# Patient Record
Sex: Male | Born: 1985
Health system: Southern US, Community
[De-identification: ages and names within clinical notes are randomized; demographics above are authoritative.]

---

## 2013-12-10 ENCOUNTER — Ambulatory Visit: Payer: Self-pay | Admitting: Family Medicine

## 2013-12-10 DIAGNOSIS — Z0289 Encounter for other administrative examinations: Secondary | ICD-10-CM

## 2013-12-24 ENCOUNTER — Other Ambulatory Visit (INDEPENDENT_AMBULATORY_CARE_PROVIDER_SITE_OTHER): Payer: 59

## 2013-12-24 ENCOUNTER — Ambulatory Visit (INDEPENDENT_AMBULATORY_CARE_PROVIDER_SITE_OTHER): Payer: 59 | Admitting: Family Medicine

## 2013-12-24 VITALS — BP 114/78 | HR 68 | Ht 73.0 in | Wt 195.0 lb

## 2013-12-24 DIAGNOSIS — M25561 Pain in right knee: Secondary | ICD-10-CM

## 2013-12-24 DIAGNOSIS — M76829 Posterior tibial tendinitis, unspecified leg: Secondary | ICD-10-CM | POA: Insufficient documentation

## 2013-12-24 DIAGNOSIS — M2142 Flat foot [pes planus] (acquired), left foot: Secondary | ICD-10-CM | POA: Insufficient documentation

## 2013-12-24 DIAGNOSIS — S83429A Sprain of lateral collateral ligament of unspecified knee, initial encounter: Secondary | ICD-10-CM

## 2013-12-24 DIAGNOSIS — M23041 Cystic meniscus, anterior horn of lateral meniscus, right knee: Secondary | ICD-10-CM | POA: Insufficient documentation

## 2013-12-24 DIAGNOSIS — M2141 Flat foot [pes planus] (acquired), right foot: Secondary | ICD-10-CM | POA: Insufficient documentation

## 2013-12-24 DIAGNOSIS — M2140 Flat foot [pes planus] (acquired), unspecified foot: Secondary | ICD-10-CM

## 2013-12-24 DIAGNOSIS — M6789 Other specified disorders of synovium and tendon, multiple sites: Secondary | ICD-10-CM

## 2013-12-24 DIAGNOSIS — M25569 Pain in unspecified knee: Secondary | ICD-10-CM

## 2013-12-24 DIAGNOSIS — M23302 Other meniscus derangements, unspecified lateral meniscus, unspecified knee: Secondary | ICD-10-CM

## 2013-12-24 MED ORDER — MELOXICAM 15 MG PO TABS: 15.0000 mg | ORAL_TABLET | Freq: Every day | ORAL | Status: DC

## 2013-12-24 NOTE — Assessment & Plan Note (Signed)
Patient was given home exercise program for this reason. Patient will try an over-the-counter strap as well as icing protocol and anti-inflammatories. Patient will try these interventions and come back again in 3 weeks for further evaluation.

## 2013-12-24 NOTE — Patient Instructions (Signed)
Good to meet you For your knee you need a patella strap or chopat strap.  Ice 20 minutes after activity and before bed can help/.  Exercises most days of the week.  Meloxicam daily for 10 days.  For your ankle try follow ing exercises as well [employer] orthotics at [employer] or online.  Come back again in 3 weeks.   Posterior Tibial Tendon Tendinitis with Rehab Tendonitis is a condition that is characterized by inflammation of a tendon or the lining (sheath) that surrounds it. The inflammation is usually caused by damage to the tendon, such as a tendon tear (strain). Sprains are classified into three categories. Grade 1 sprains cause pain, but the tendon is not lengthened. Grade 2 sprains include a lengthened ligament due to the ligament being stretched or partially ruptured. With grade 2 sprains there is still function, although the function may be diminished. Grade 3 sprains are characterized by a complete tear of the tendon or muscle, and function is usually impaired. Posterior tibialis tendonitis is tendonitis of the posterior tibial tendon, which attaches muscles of the lower leg to the foot. The posterior tibial tendon is located in the back of the ankle and helps the body straighten (plantarflex) and rotate inward (medially rotate) the ankle. SYMPTOMS   Pain, tenderness, swelling, warmth, and/or redness over the back of the inner ankle at the posterior tibial tendon or the inner part of the mid-foot.  Pain that worsens with plantarflexion or medial rotation of the ankle.  A crackling sound (crepitation) when the tendon is moved or touched. CAUSES  Posterior tibial tendonitis occurs when damage to the posterior tibial tendon starts an inflammatory response. Common mechanisms of injury include:  Degenerative (occurs with aging) processes that weaken the tendon and make it more susceptible to injury.  Stress placed on the tendon from an increase in the intensity, frequency, or duration of  training.  Direct trauma to the ankle.  Returning to activity before a previous ankle injury is allowed to heal. RISK INCREASES WITH:  Activities that involve repetitive and/or stressful plantarflexion (jumping, kicking, or running up/down hills).  Poor strength and flexibility.  Flat feet.  Previous injury to the foot, ankle, or leg. PREVENTION   Warm up and stretch properly before activity.  Allow for adequate recovery between workouts.  Maintain physical fitness:  Strength, flexibility, and endurance.  Cardiovascular fitness.  Learn and use proper technique. When possible, have a coach correct improper technique.  Complete rehabilitation from a previous foot, ankle, or leg injury.  If you have flat feet, wear arch supports (orthotics). PROGNOSIS  If treated properly, then the symptoms of tendonitis usually resolve within 6 weeks. This period may be shorter for injuries caused by direct trauma. RELATED COMPLICATIONS   Prolonged healing time, if improperly treated or re-injured.  Recurrent symptoms that result in a chronic problem.  Partial or complete tendon tear (rupture) requiring surgery. TREATMENT  Treatment initially involves the use of ice and medication to help reduce pain and inflammation. The use of strengthening and stretching exercises may help reduce pain with activity. These exercises may be performed at home or with referral to a therapist. Often times, your caregiver will recommend immobilizing the ankle to allow the tendon to heal. If you have flat feet, the you may be advised to wear orthotic arch supports. If symptoms persist for greater than 6 months despite non-surgical (conservative) treatment, then surgery may be recommended. MEDICATION   If pain medication is necessary, then nonsteroidal anti-inflammatory  medications, such as aspirin and ibuprofen, or other minor pain relievers, such as acetaminophen, are often recommended.  Do not take pain  medication for 7 days before surgery.  Prescription pain relievers may be given if deemed necessary by your caregiver. Use only as directed and only as much as you need.  Corticosteroid injections may be given by your caregiver. These injections should be reserved for the most serious cases, because they may only be given a certain number of times. HEAT AND COLD  Cold treatment (icing) relieves pain and reduces inflammation. Cold treatment should be applied for 10 to 15 minutes every 2 to 3 hours for inflammation and pain and immediately after any activity that aggravates your symptoms. Use ice packs or massage the area with a piece of ice (ice massage).  Heat treatment may be used prior to performing the stretching and strengthening activities prescribed by your caregiver, physical therapist, or athletic trainer. Use a heat pack or soak the injury in warm water. SEEK MEDICAL CARE IF:  Treatment seems to offer no benefit, or the condition worsens.  Any medications produce adverse side effects. EXERCISES RANGE OF MOTION (ROM) AND STRETCHING EXERCISES - Posterior Tibial Tendon Tendinitis These exercises may help you when beginning to rehabilitate your injury. Your symptoms may resolve with or without further involvement from your physician, physical therapist or athletic trainer. While completing these exercises, remember:   Restoring tissue flexibility helps normal motion to return to the joints. This allows healthier, less painful movement and activity.  An effective stretch should be held for at least 30 seconds.  A stretch should never be painful. You should only feel a gentle lengthening or release in the stretched tissue. RANGE OF MOTION - Ankle Plantar Flexion   Sit with your right / left leg crossed over your opposite knee.  Use your opposite hand to pull the top of your foot and toes toward you.  You should feel a gentle stretch on the top of your foot/ankle. Hold this position  for __________ seconds. Repeat __________ times. Complete this exercise __________ times per day.  RANGE OF MOTION - Ankle Eversion   Sit with your right / left ankle crossed over your opposite knee.  Grip your foot with your opposite hand, placing your thumb on the top of your foot and your fingers across the bottom of your foot.  Gently push your foot downward with a slight rotation so your littlest toes rise slightly  You should feel a gentle stretch on the inside of your ankle. Hold the stretch for __________ seconds. Repeat __________ times. Complete this exercise __________ times per day.  RANGE OF MOTION - Ankle Inversion   Sit with your right / left ankle crossed over your opposite knee.  Grip your foot with your opposite hand, placing your thumb on the bottom of your foot and your fingers across the top of your foot.  Gently pull your foot so the smallest toe comes toward you and your thumb pushes the inside of the ball of your foot away from you.  You should feel a gentle stretch on the outside of your ankle. Hold the stretch for __________ seconds. Repeat __________ times. Complete this exercise __________ times per day.  RANGE OF MOTION - Dorsi/Plantar Flexion  While sitting with your right / left knee straight, draw the top of your foot upwards by flexing your ankle. Then reverse the motion, pointing your toes downward.  Hold each position for __________ seconds.  After completing  your first set of exercises, repeat this exercise with your knee bent. Repeat __________ times. Complete this exercise __________ times per day.  RANGE OF MOTION - Ankle Alphabet  Imagine your right / left big toe is a pen.  Keeping your hip and knee still, write out the entire alphabet with your "pen." Make the letters as large as you can without increasing any discomfort. Repeat __________ times. Complete this exercise __________ times per day.  STRETCH - Gastrocsoleus   Sit with your  right / left leg extended. Holding onto both ends of a belt or towel, loop it around the ball of your foot.  Keeping your right / left ankle and foot relaxed and your knee straight, pull your foot and ankle toward you using the belt/towel.  You should feel a gentle stretch behind your calf or knee. Hold this position for __________ seconds. Repeat __________ times. Complete this exercise __________ times per day.  STRETCH  Gastroc, Standing   Place hands on wall.  Extend right / left leg, keeping the front knee somewhat bent.  Slightly point your toes inward on your back foot.  Keeping your right / left heel on the floor and your knee straight, shift your weight toward the wall, not allowing your back to arch.  You should feel a gentle stretch in the right / left calf. Hold this position for __________ seconds. Repeat __________ times. Complete this stretch __________ times per day. STRETCH  Soleus, Standing   Place hands on wall.  Extend right / left leg, keeping the other knee somewhat bent.  Slightly point your toes inward on your back foot.  Keep your right / left heel on the floor, bend your back knee, and slightly shift your weight over the back leg so that you feel a gentle stretch deep in your back calf.  Hold this position for __________ seconds. Repeat __________ times. Complete this stretch __________ times per day. STRENGTHENING EXERCISES - Posterior Tibial Tendon Tendinitis These exercises may help you when beginning to rehabilitate your injury. They may resolve your symptoms with or without further involvement from your physician, physical therapist or athletic trainer. While completing these exercises, remember:   Muscles can gain both the endurance and the strength needed for everyday activities through controlled exercises.  Complete these exercises as instructed by your physician, physical therapist or athletic trainer. Progress the resistance and repetitions only  as guided. STRENGTH - Dorsiflexors  Secure a rubber exercise band/tubing to a fixed object (ie. table, pole) and loop the other end around your right / left foot.  Sit on the floor facing the fixed object. The band/tubing should be slightly tense when your foot is relaxed.  Slowly draw your foot back toward you using your ankle and toes.  Hold this position for __________ seconds. Slowly release the tension in the band and return your foot to the starting position. Repeat __________ times. Complete this exercise __________ times per day.  STRENGTH - Towel Curls  Sit in a chair positioned on a non-carpeted surface.  Place your foot on a towel, keeping your heel on the floor.  Pull the towel toward your heel by only curling your toes. Keep your heel on the floor.  If instructed by your physician, physical therapist or athletic trainer, add ____________________ at the end of the towel. Repeat __________ times. Complete this exercise __________ times per day. STRENGTH - Ankle Eversion   Secure one end of a rubber exercise band/tubing to a fixed object (  table, pole). Loop the other end around your foot just before your toes.  Place your fists between your knees. This will focus your strengthening at your ankle.  Drawing the band/tubing across your opposite foot, slowly, pull your little toe out and up. Make sure the band/tubing is positioned to resist the entire motion.  Hold this position for __________ seconds.  Have your muscles resist the band/tubing as it slowly pulls your foot back to the starting position. Repeat __________ times. Complete this exercise __________ times per day.  STRENGTH - Ankle Inversion   Secure one end of a rubber exercise band/tubing to a fixed object (table, pole). Loop the other end around your foot just before your toes.  Place your fists between your knees. This will focus your strengthening at your ankle.  Slowly, pull your big toe up and in, making  sure the band/tubing is positioned to resist the entire motion.  Hold this position for __________ seconds.  Have your muscles resist the band/tubing as it slowly pulls your foot back to the starting position. Repeat __________ times. Complete this exercises __________ times per day.  Document Released: 06/28/2005 Document Revised: 09/20/2011 Document Reviewed: 10/10/2008 [facility] Patient Information 2014 [place], [place].

## 2013-12-24 NOTE — Progress Notes (Signed)
Tawana ScaleZach Anushree Cantu D.O. [facility] [address] [place], [place] [address] Phone: [phone] Subjective:     CC: Right knee pain  GNF:[id number]:Subjective Raymond LazierShannon Robinson-Funny is a 28 y.o. male coming in with complaint of right knee pain. Patient states he has had this pain for multiple years. Patient used to play significant number of sports and still is very active. Patient states that he has pain on the anterior lateral aspect of his knee after activity and notices some swelling from time to time. Patient states that the knee feels stable and never feels like it is going to give out on him. Denies any radiation of pain or any numbness. Patient denies any locking on him. Patient has not tried any physical modalities but just tries to change his activity. Patient was the severity a 6/10. Denies any nighttime awakening's. No radiation no numbness.     Past medical history, social, surgical and family history all reviewed in electronic medical record.   Review of Systems: No headache, visual changes, nausea, vomiting, diarrhea, constipation, dizziness, abdominal pain, skin rash, fevers, chills, night sweats, weight loss, swollen lymph nodes, body aches, joint swelling, muscle aches, chest pain, shortness of breath, mood changes.   Objective Blood pressure 114/78, pulse 68, height 6\' 1"  (1.854 m), weight 195 lb (88.451 kg), SpO2 97.00%.  General: No apparent distress alert and oriented x3 mood and affect normal, dressed appropriately.  HEENT: Pupils equal, extraocular movements intact  Respiratory: Patient's speak in full sentences and does not appear short of breath  Cardiovascular: No lower extremity edema, non tender, no erythema  Skin: Warm dry intact with no signs of infection or rash on extremities or on axial skeleton.  Abdomen: Soft nontender  Neuro: Cranial nerves II through XII are intact, neurovascularly intact in all extremities with 2+ DTRs and 2+ pulses.  Lymph: No  lymphadenopathy of posterior or anterior cervical chain or axillae bilaterally.  Gait normal with good balance and coordination.  MSK:  Non tender with full range of motion and good stability and symmetric strength and tone of shoulders, elbows, wrist, hip, and ankles bilaterally.  Knee: Right On inspection patient's does have very large hypertrophy of the tibial tuberosity bilaterally. Patient is minimally tender over the anterior lateral joint line. ROM full in flexion and extension and lower leg rotation. Ligaments with solid consistent endpoints including ACL, PCL, LCL, MCL. Negative Mcmurray's, Apley's, and Thessalonian tests. Non painful patellar compression. Patellar glide without crepitus. Patellar and quadriceps tendons unremarkable. Hamstring and quadriceps strength is normal.  Contralateral knee unremarkable except for the hypertrophy of the tibial tuberosity.  Exam shows the patient does have pes planus bilaterally. And he is minimally tender to palpation of the posterior tibialis especially of the right side. Neurovascularly intact distally.  MSK US performed of: Right knee This study was ordered, performed, and interpreted by Terrilee FilesZach Luvina Poirier D.O.  Knee: All structures visualized. Anteromedial, , posteromedial, and posterolateral menisci unremarkable without tearing, fraying, effusion, or displacement. Patient on the inferior portion of the fat pad does have what appears to be acutely and cyst formation just proximal to the tibial tuberosity. This does cause some mild discomfort with palpation. In addition patient also has what appears to be apparent meniscal cyst of the lateral meniscus. Patient also has some hypoechoic changes around the LCL. Patellar Tendon unremarkable on long and transverse views without effusion. No abnormality of prepatellar bursa.  MCL unremarkable on long and transverse views. No abnormality of origin of medial or  lateral head of the  gastrocnemius.  IMPRESSION:  Paramedical cyst the lateral meniscus as well as ganglion cyst inferior to the patellar tendon and questionable LCL injury.      Impression and Recommendations:     This case required medical decision making of moderate complexity.

## 2013-12-24 NOTE — Assessment & Plan Note (Signed)
Think this likely is just a incidental finding and is not likely what giving patient pain. Patient had a negative McMurray's on exam today. Patient is still able to do all other activities and will come back again in 3 weeks for further evaluation.

## 2013-12-24 NOTE — Assessment & Plan Note (Signed)
Patient's 5 feet could be contributing patient's knee pain. We discussed over-the-counter orthotics was given home exercise program for the posterior tibialis tendons to see if this will make some improvement. We will redress again in 3 weeks.

## 2014-01-14 ENCOUNTER — Ambulatory Visit (INDEPENDENT_AMBULATORY_CARE_PROVIDER_SITE_OTHER)
Admission: RE | Admit: 2014-01-14 | Discharge: 2014-01-14 | Disposition: A | Discharge status: Home / Self Care | Payer: 59 | Source: Ambulatory Visit | Attending: Family Medicine | Admitting: Family Medicine

## 2014-01-14 ENCOUNTER — Ambulatory Visit (INDEPENDENT_AMBULATORY_CARE_PROVIDER_SITE_OTHER): Payer: 59 | Admitting: Family Medicine

## 2014-01-14 VITALS — BP 120/72 | HR 68 | Ht 73.0 in | Wt 198.0 lb

## 2014-01-14 DIAGNOSIS — M25569 Pain in unspecified knee: Secondary | ICD-10-CM

## 2014-01-14 DIAGNOSIS — M25561 Pain in right knee: Secondary | ICD-10-CM

## 2014-01-14 DIAGNOSIS — M23041 Cystic meniscus, anterior horn of lateral meniscus, right knee: Secondary | ICD-10-CM

## 2014-01-14 DIAGNOSIS — M23302 Other meniscus derangements, unspecified lateral meniscus, unspecified knee: Secondary | ICD-10-CM

## 2014-01-14 NOTE — Assessment & Plan Note (Signed)
Patient did have injection within the anterior horn of the lateral meniscus cyst. Patient tolerated procedure well with complete resolution of the pain. It appeared that the cyst was larger than anticipated. No aspiration the was able to be obtained. We discussed bracing still in avoiding any deep squats for another 2 weeks. We discussed repetitive jumping may not be beneficial. Patient is to do a trial of a topical anti-inflammatory as well as change the positioning of the brace. Patient did have a patellar strap and we'll change it to be more higher causing a changing fulcrum of the patellar itself. Patient and will come back again in 3 weeks for further evaluation and treatment.  Spent greater than 25 minutes with patient face-to-face and had greater than 50% of counseling including as described above in assessment and plan.

## 2014-01-14 NOTE — Patient Instructions (Signed)
Good to see you Try new exercises 3 times a week  Ice in 6 hours and after activity and before bed Wear brace more on the bottom of the patella.  Try the medicine 2 times daily until it is gone.  Xray downstairs today.  Come back again in 3 weeks.

## 2014-01-14 NOTE — Progress Notes (Signed)
  Tawana ScaleZach Ruthetta Koopmann D.O. [facility] [address] [place], [place] [address] Phone: [phone]([phone] Subjective:     CC: Right knee pain followup  UUV:[id number]:Subjective Jaci LazierShannon Robinson-Funny is a 28 y.o. male coming in with complaint of right knee pain. Patient states that she's been doing exercises as well as wearing a brace without any significant improvement. Patient states when he tries to do a lot of activity he starts having pain. Patient was seen previously and did have apparent meniscal cyst mostly over the lateral aspect. Patient also had a ganglion cyst that appeared to be on ultrasound the patient states is the main area of discomfort. No new symptoms has the same symptoms he said previously. Denies any worsening of the pain just no improvement.  Patient states that the ankle pain he was having previously is completely resolved at this time.     Past medical history, social, surgical and family history all reviewed in electronic medical record.   Review of Systems: No headache, visual changes, nausea, vomiting, diarrhea, constipation, dizziness, abdominal pain, skin rash, fevers, chills, night sweats, weight loss, swollen lymph nodes, body aches, joint swelling, muscle aches, chest pain, shortness of breath, mood changes.   Objective Blood pressure 120/72, pulse 68, height 6\' 1"  (1.854 m), weight 198 lb (89.812 kg), SpO2 97.00%.  General: No apparent distress alert and oriented x3 mood and affect normal, dressed appropriately.  HEENT: Pupils equal, extraocular movements intact  Respiratory: Patient's speak in full sentences and does not appear short of breath  Cardiovascular: No lower extremity edema, non tender, no erythema  Skin: Warm dry intact with no signs of infection or rash on extremities or on axial skeleton.  Abdomen: Soft nontender  Neuro: Cranial nerves II through XII are intact, neurovascularly intact in all extremities with 2+ DTRs and 2+ pulses.  Lymph: No  lymphadenopathy of posterior or anterior cervical chain or axillae bilaterally.  Gait normal with good balance and coordination.  MSK:  Non tender with full range of motion and good stability and symmetric strength and tone of shoulders, elbows, wrist, hip, and ankles bilaterally.  Knee: Right On inspection patient's does have very large hypertrophy of the tibial tuberosity bilaterally. Patient is minimally tender over the anterior lateral joint line. ROM full in flexion and extension and lower leg rotation. Ligaments with solid consistent endpoints including ACL, PCL, LCL, MCL. Negative Mcmurray's, Apley's, and Thessalonian tests. Non painful patellar compression. Patellar glide without crepitus. Patellar and quadriceps tendons unremarkable. Hamstring and quadriceps strength is normal.  Contralateral knee unremarkable except for the hypertrophy of the tibial tuberosity.  Exam shows the patient does have pes planus bilaterally. And he is minimally tender to palpation of the posterior tibialis especially of the right side. Neurovascularly intact distally.  After informed written and verbal consent, patient was seated on exam table. Right knee was prepped with alcohol swab and utilizing anterolateral approach, patient's right knee space was injected with 4:1  marcaine 0.5%: Kenalog 40mg /dL. Patient tolerated the procedure well without immediate complications.     Impression and Recommendations:     This case required medical decision making of moderate complexity.

## 2014-02-04 ENCOUNTER — Ambulatory Visit: Payer: 59 | Admitting: Family Medicine

## 2014-02-11 ENCOUNTER — Ambulatory Visit (INDEPENDENT_AMBULATORY_CARE_PROVIDER_SITE_OTHER): Payer: 59 | Admitting: Family Medicine

## 2014-02-11 VITALS — BP 124/84 | HR 80 | Ht 73.0 in | Wt 196.0 lb

## 2014-02-11 DIAGNOSIS — M25561 Pain in right knee: Secondary | ICD-10-CM

## 2014-02-11 DIAGNOSIS — M23041 Cystic meniscus, anterior horn of lateral meniscus, right knee: Secondary | ICD-10-CM

## 2014-02-11 DIAGNOSIS — M23302 Other meniscus derangements, unspecified lateral meniscus, unspecified knee: Secondary | ICD-10-CM

## 2014-02-11 DIAGNOSIS — M25569 Pain in unspecified knee: Secondary | ICD-10-CM

## 2014-02-11 NOTE — Assessment & Plan Note (Signed)
Patient overall has improved on physical exam within normal exam. Patient is still gives history of internal derangement type symptoms. I discussed with him the possibility of an MRI for further evaluation which patient declined today. Patient will start formal physical therapy though. We will have sent in the referral. Discuss continued home exercises as well as bracing. We'll continue with the icing regimen as well. Patient will follow up in 4 weeks. If continuing to have pain at that time I think I would have to encourage advance imaging to rule out any other laxity in the ligaments that has not been appreciated on exam.  Spent greater than 25 minutes with patient face-to-face and had greater than 50% of counseling including as described above in assessment and plan.

## 2014-02-11 NOTE — Patient Instructions (Signed)
Good to see you Physical therapy will be calling you.  Still ice and brace and home exercises.  If at gym would love for you to bike.  If having pain with increase activity more than just muscle pain then call me and we will order MRI first.  Otherwise come back in 4 weeks.

## 2014-02-11 NOTE — Progress Notes (Signed)
  Tawana ScaleZach Rhyan Radler D.O. [facility] [address] [place], [place] [address] Phone: [phone] Subjective:     CC: Right knee pain followup  OZH:[id number]:Subjective Raymond LazierShannon Cantu is a 28 y.o. male coming in with complaint of right knee pain. Patient was seen previously and did have an injection for a cyst on the anterior horn of the lateral meniscus. Patient also had an LCL sprain. Patient states that the knee pain has improved but is still having times when it clicks or feels like it's giving out on him. Patient is not playing any lacrosse because he feels that the knee is unstable overall. Patient continues to do the exercises he states fairly regularly. He is trying to remain active. Denies any new symptoms except for increasing popping but once again states that the pain is better after injection.  Patient states that the ankle pain he was having previously is completely resolved at this time.     Past medical history, social, surgical and family history all reviewed in electronic medical record.   Review of Systems: No headache, visual changes, nausea, vomiting, diarrhea, constipation, dizziness, abdominal pain, skin rash, fevers, chills, night sweats, weight loss, swollen lymph nodes, body aches, joint swelling, muscle aches, chest pain, shortness of breath, mood changes.   Objective Blood pressure 124/84, pulse 80, height 6\' 1"  (1.854 m), weight 196 lb (88.905 kg), SpO2 97.00%.  General: No apparent distress alert and oriented x3 mood and affect normal, dressed appropriately. Patient did not make significant I contacted playing with phone HEENT: Pupils equal, extraocular movements intact  Respiratory: Patient's speak in full sentences and does not appear short of breath  Cardiovascular: No lower extremity edema, non tender, no erythema  Skin: Warm dry intact with no signs of infection or rash on extremities or on axial skeleton.  Abdomen: Soft nontender  Neuro: Cranial nerves  II through XII are intact, neurovascularly intact in all extremities with 2+ DTRs and 2+ pulses.  Lymph: No lymphadenopathy of posterior or anterior cervical chain or axillae bilaterally.  Gait normal with good balance and coordination.  MSK:  Non tender with full range of motion and good stability and symmetric strength and tone of shoulders, elbows, wrist, hip, and ankles bilaterally.  Knee: Right On inspection patient's does have very large hypertrophy of the tibial tuberosity bilaterally. Patient is minimally tender over the anterior lateral joint line. ROM full in flexion and extension and lower leg rotation. Ligaments with solid consistent endpoints including ACL, PCL, LCL, MCL. Negative Mcmurray's, Apley's, and Thessalonian tests. Non painful patellar compression. Patellar glide without crepitus. Patellar and quadriceps tendons unremarkable. Hamstring and quadriceps strength is normal.  Contralateral knee unremarkable except for the hypertrophy of the tibial tuberosity.   limited a few skeletal ultrasound was performed and interpreted by me today. Limited ultrasound of the right knee shows the patient does have a very small effusion of the knee still. Patient's toe cyst on the lateral meniscus is resolved.  Impression: Resolved lateral meniscus cyst but continued suprapatellar effusion.     Impression and Recommendations:     This case required medical decision making of moderate complexity.

## 2014-02-25 ENCOUNTER — Ambulatory Visit: Payer: 59 | Admitting: Physical Therapy

## 2014-03-11 ENCOUNTER — Ambulatory Visit: Payer: 59 | Admitting: Family Medicine

## 2015-05-21 IMAGING — CR DG KNEE STANDING AP BILAT
1 series · 1 of 1 positions shown · non-contrast
Comparison: None.

CLINICAL DATA: Right knee pain.

EXAM:
BILATERAL KNEES STANDING - 1 VIEW

[view not recorded]
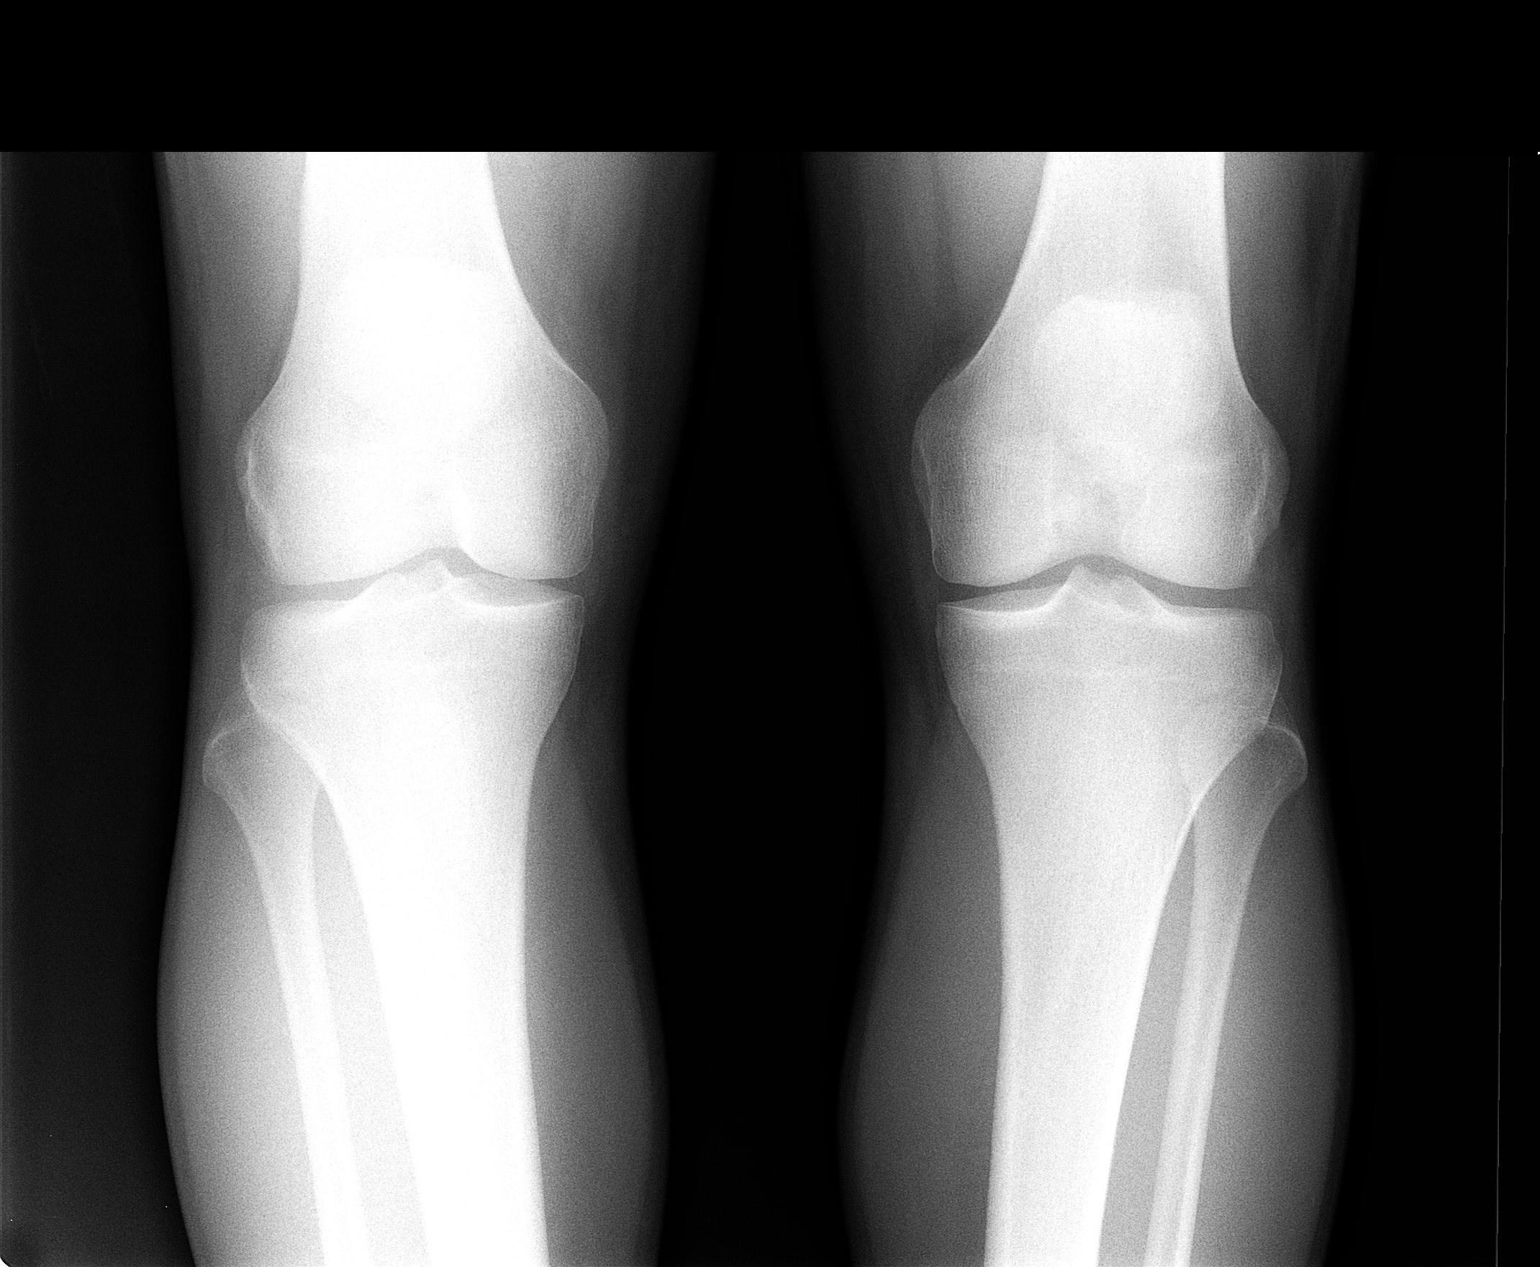

[1 of 1 positions shown; findings below may reference images not displayed]

FINDINGS: Standing views of both knees demonstrates no evidence of joint space
narrowing, osteophyte formation, or other abnormality.
IMPRESSION: Negative.

## 2016-08-11 DIAGNOSIS — Z131 Encounter for screening for diabetes mellitus: Secondary | ICD-10-CM | POA: Diagnosis not present

## 2016-08-11 DIAGNOSIS — Z13220 Encounter for screening for lipoid disorders: Secondary | ICD-10-CM | POA: Diagnosis not present

## 2016-08-11 DIAGNOSIS — Z0000 Encounter for general adult medical examination without abnormal findings: Secondary | ICD-10-CM | POA: Diagnosis not present

## 2017-08-19 DIAGNOSIS — Z0000 Encounter for general adult medical examination without abnormal findings: Secondary | ICD-10-CM | POA: Diagnosis not present

## 2018-09-12 DIAGNOSIS — J069 Acute upper respiratory infection, unspecified: Secondary | ICD-10-CM | POA: Diagnosis not present

## 2019-01-25 ENCOUNTER — Ambulatory Visit (INDEPENDENT_AMBULATORY_CARE_PROVIDER_SITE_OTHER): Payer: 59 | Admitting: Internal Medicine

## 2019-01-25 ENCOUNTER — Other Ambulatory Visit: Payer: Self-pay

## 2019-01-25 ENCOUNTER — Ambulatory Visit: Payer: Self-pay | Admitting: Internal Medicine

## 2019-01-25 ENCOUNTER — Other Ambulatory Visit (INDEPENDENT_AMBULATORY_CARE_PROVIDER_SITE_OTHER): Payer: 59

## 2019-01-25 ENCOUNTER — Encounter: Payer: Self-pay | Admitting: Internal Medicine

## 2019-01-25 VITALS — BP 110/80 | HR 80 | Temp 98.5°F | Ht 73.0 in | Wt 207.0 lb

## 2019-01-25 DIAGNOSIS — Z0000 Encounter for general adult medical examination without abnormal findings: Secondary | ICD-10-CM

## 2019-01-25 DIAGNOSIS — M23041 Cystic meniscus, anterior horn of lateral meniscus, right knee: Secondary | ICD-10-CM

## 2019-01-25 DIAGNOSIS — H6123 Impacted cerumen, bilateral: Secondary | ICD-10-CM

## 2019-01-25 DIAGNOSIS — Z833 Family history of diabetes mellitus: Secondary | ICD-10-CM | POA: Diagnosis not present

## 2019-01-25 LAB — LIPID PANEL
Cholesterol: 155 mg/dL (ref 0–200)
HDL: 38.7 mg/dL — ABNORMAL LOW (ref >39.00)
LDL Cholesterol: 89 mg/dL (ref 0–99)
NonHDL: 116.52
Total CHOL/HDL Ratio: 4
Triglycerides: 140 mg/dL (ref 0.0–149.0)
VLDL: 28 mg/dL (ref 0.0–40.0)

## 2019-01-25 LAB — BASIC METABOLIC PANEL
BUN: 13 mg/dL (ref 6–23)
CO2: 28 mEq/L (ref 19–32)
Calcium: 10 mg/dL (ref 8.4–10.5)
Chloride: 104 mEq/L (ref 96–112)
Creatinine, Ser: 1.1 mg/dL (ref 0.40–1.50)
GFR: 94.33 mL/min (ref >60.00)
Glucose, Bld: 75 mg/dL (ref 70–99)
Potassium: 4.2 mEq/L (ref 3.5–5.1)
Sodium: 138 mEq/L (ref 135–145)

## 2019-01-25 LAB — HEMOGLOBIN A1C: Hgb A1c MFr Bld: 5.6 % (ref 4.6–6.5)

## 2019-01-25 NOTE — Progress Notes (Signed)
Subjective:  Patient ID: Raymond Cantu, male    DOB: 1986/04/08  Age: 33 y.o. MRN: [id number]  CC: Annual Exam  NEW TO ME  HPI Raymond Cantu presents for a CPX.  He complains of a several week history of decreased hearing on both sides.  He denies ear pain or swelling.  History Raymond Cantu has no past medical history on file.   He has no past surgical history on file.   His family history includes Diabetes Mellitus II in his father and mother; Heart failure in his mother.He reports that he has never smoked. He has never used smokeless tobacco. He reports previous alcohol use. He reports that he does not use drugs.  Outpatient Medications Prior to Visit  Medication Sig Dispense Refill  . meloxicam (MOBIC) 15 MG tablet Take 1 tablet (15 mg total) by mouth daily. 30 tablet 0   No facility-administered medications prior to visit.     ROS Review of Systems  Constitutional: Negative.   HENT: Positive for hearing loss. Negative for congestion, ear pain, postnasal drip, sinus pressure and tinnitus.   Eyes: Negative.   Respiratory: Negative for cough and shortness of breath.   Cardiovascular: Negative.   Gastrointestinal: Negative for abdominal pain, constipation, diarrhea, nausea and vomiting.  Endocrine: Negative.   Genitourinary: Negative.  Negative for difficulty urinating, discharge, dysuria, scrotal swelling, testicular pain and urgency.  Musculoskeletal: Negative.   Skin: Negative.  Negative for color change.  Neurological: Negative.  Negative for dizziness, weakness, light-headedness and headaches.  Hematological: Negative for adenopathy. Does not bruise/bleed easily.  Psychiatric/Behavioral: Negative.     Objective:  BP 110/80 (BP Location: Left Arm, Patient Position: Sitting, Cuff Size: Normal)   Pulse 80   Temp 98.5 F (36.9 C) (Oral)   Ht 6\' 1"  (1.854 m)   Wt 207 lb (93.9 kg)   SpO2 99%   BMI 27.31 kg/m   Physical Exam Vitals signs reviewed.   HENT:     Right Ear: There is impacted cerumen.     Left Ear: There is impacted cerumen.     Ears:     Comments: Patient's verbal consent obtained prior to lighted suction removal.  I used the Bionix lighted suction to remove the cerumen.  The cerumen was removed and a full view of tympanic membrane was possible after the procedure.  Patient tolerated procedure well.     Nose: Nose normal. No congestion or rhinorrhea.     Mouth/Throat:     Mouth: Mucous membranes are moist.  Eyes:     General: No scleral icterus.    Conjunctiva/sclera: Conjunctivae normal.  Neck:     Musculoskeletal: Normal range of motion. No neck rigidity.  Cardiovascular:     Rate and Rhythm: Normal rate and regular rhythm.     Heart sounds: No murmur.  Pulmonary:     Effort: Pulmonary effort is normal.     Breath sounds: No stridor. No wheezing, rhonchi or rales.  Abdominal:     General: Abdomen is flat. Bowel sounds are normal. There is no distension.     Palpations: There is no hepatomegaly or splenomegaly.     Tenderness: There is no abdominal tenderness.     Hernia: No hernia is present.  Musculoskeletal: Normal range of motion.  Lymphadenopathy:     Cervical: No cervical adenopathy.  Skin:    General: Skin is warm.     Findings: No rash.  Neurological:     General: No focal deficit present.  Psychiatric:        Mood and Affect: Mood normal.        Behavior: Behavior normal.     Lab Results  Component Value Date   GLUCOSE 75 01/25/2019   CHOL 155 01/25/2019   TRIG 140.0 01/25/2019   HDL 38.70 (L) 01/25/2019   LDLCALC 89 01/25/2019   NA 138 01/25/2019   K 4.2 01/25/2019   CL 104 01/25/2019   CREATININE 1.10 01/25/2019   BUN 13 01/25/2019   CO2 28 01/25/2019   HGBA1C 5.6 01/25/2019    Assessment & Plan:   Raymond Cantu was seen today for annual exam.  Diagnoses and all orders for this visit:  Routine general medical examination at a health care facility- Exam completed, labs reviewed,  vaccines reviewed, patient education material was given. -     Lipid panel; Future  Hearing loss due to cerumen impaction, bilateral- I put Colace in the right ear and then used an ear pick to remove the cerumen.  Most of the cerumen was removed but there is some that is adhered to the tympanic membrane.  He will come back in a week or 2 to have the cerumen removed from the left ear and to try to make more progress on the right ear.  Cyst of anterior horn of lateral meniscus of right knee  Family history of diabetes mellitus type II- His blood sugars are normal.  He was advised of a healthy lifestyle to try to prevent the development of diabetes. -     Basic metabolic panel; Future -     Hemoglobin A1c; Future   I have discontinued Raymond Cantu's meloxicam.  No orders of the defined types were placed in this encounter.    Follow-up: Return if symptoms worsen or fail to improve.  Raymond Calico, MD

## 2019-01-25 NOTE — Patient Instructions (Signed)

## 2019-01-26 ENCOUNTER — Encounter: Payer: Self-pay | Admitting: Internal Medicine

## 2019-02-08 ENCOUNTER — Encounter: Payer: Self-pay | Admitting: Internal Medicine

## 2019-02-08 ENCOUNTER — Ambulatory Visit (INDEPENDENT_AMBULATORY_CARE_PROVIDER_SITE_OTHER): Payer: 59 | Admitting: Internal Medicine

## 2019-02-08 ENCOUNTER — Other Ambulatory Visit: Payer: Self-pay

## 2019-02-08 VITALS — BP 130/70 | HR 78 | Temp 98.6°F | Resp 16 | Ht 73.0 in | Wt 203.0 lb

## 2019-02-08 DIAGNOSIS — H6123 Impacted cerumen, bilateral: Secondary | ICD-10-CM | POA: Diagnosis not present

## 2019-02-08 NOTE — Progress Notes (Signed)
Subjective:  Patient ID: Raymond Cantu, male    DOB: July 03, 1986  Age: 33 y.o. MRN: [id number]  CC: Hearing Loss   HPI [name] presents for f/up - He continues to struggle with hearing loss and wants the cerumen removed from both ears.  No outpatient medications prior to visit.   No facility-administered medications prior to visit.     ROS Review of Systems  HENT: Positive for hearing loss. Negative for ear pain, facial swelling and sinus pressure.   Eyes: Negative.   Respiratory: Negative.  Negative for cough and chest tightness.   Cardiovascular: Negative for chest pain and leg swelling.  Gastrointestinal: Negative for abdominal pain.  Endocrine: Negative.   Genitourinary: Negative.   Musculoskeletal: Negative.   Skin: Negative for color change and rash.  Neurological: Negative.  Negative for dizziness, weakness and headaches.  Hematological: Negative for adenopathy. Does not bruise/bleed easily.  Psychiatric/Behavioral: Negative.   All other systems reviewed and are negative.   Objective:  BP 130/70 (BP Location: Left Arm, Patient Position: Sitting, Cuff Size: Large)   Pulse 78   Temp 98.6 F (37 C) (Oral)   Resp 16   Ht 6\' 1"  (1.854 m)   Wt 203 lb (92.1 kg)   SpO2 98%   BMI 26.78 kg/m   BP Readings from Last 3 Encounters:  02/08/19 130/70  01/25/19 110/80  02/11/14 124/84    Wt Readings from Last 3 Encounters:  02/08/19 203 lb (92.1 kg)  01/25/19 207 lb (93.9 kg)  02/11/14 196 lb (88.9 kg)    Physical Exam Vitals signs reviewed.  HENT:     Right Ear: Decreased hearing noted. No drainage, swelling or tenderness. No middle ear effusion. There is impacted cerumen. No mastoid tenderness. Tympanic membrane is not injected.     Left Ear: Decreased hearing noted. No drainage, swelling or tenderness.  No middle ear effusion. There is impacted cerumen. No mastoid tenderness. Tympanic membrane is not injected.     Nose: Nose normal.   Mouth/Throat:     Pharynx: Oropharynx is clear.  Eyes:     General: No scleral icterus.    Conjunctiva/sclera: Conjunctivae normal.  Neck:     Musculoskeletal: Normal range of motion. No neck rigidity or muscular tenderness.  Cardiovascular:     Rate and Rhythm: Normal rate and regular rhythm.     Pulses: Normal pulses.     Heart sounds: No murmur.  Pulmonary:     Effort: Pulmonary effort is normal. No respiratory distress.     Breath sounds: No stridor. No wheezing, rhonchi or rales.  Abdominal:     General: Abdomen is flat.  Musculoskeletal: Normal range of motion.  Lymphadenopathy:     Cervical: No cervical adenopathy.  Skin:    General: Skin is warm and dry.     Coloration: Skin is not pale.  Neurological:     General: No focal deficit present.     Mental Status: He is oriented to person, place, and time. Mental status is at baseline.  Psychiatric:        Mood and Affect: Mood normal.        Behavior: Behavior normal.     Lab Results  Component Value Date   GLUCOSE 75 01/25/2019   CHOL 155 01/25/2019   TRIG 140.0 01/25/2019   HDL 38.70 (L) 01/25/2019   LDLCALC 89 01/25/2019   NA 138 01/25/2019   K 4.2 01/25/2019   CL 104 01/25/2019   CREATININE 1.10 01/25/2019  BUN 13 01/25/2019   CO2 28 01/25/2019   HGBA1C 5.6 01/25/2019    Dg Knee Bilateral Standing Ap  Result Date: 01/14/2014 CLINICAL DATA:  Right knee pain. EXAM: BILATERAL KNEES STANDING - 1 VIEW COMPARISON:  None. FINDINGS: Standing views of both knees demonstrates no evidence of joint space narrowing, osteophyte formation, or other abnormality. IMPRESSION: Negative. Electronically Signed   By: Geanie CooleyJim  Maxwell M.D.   On: 01/14/2014 10:22   Dg Knee Complete 4 Views Right  Result Date: 01/14/2014 CLINICAL DATA:  Chronic right knee pain.  No known injury. EXAM: RIGHT KNEE - COMPLETE 4+ VIEW COMPARISON:  None. FINDINGS: There is no evidence of fracture, dislocation, or joint effusion. There is no evidence of  arthropathy or other focal bone abnormality. Soft tissues are unremarkable. IMPRESSION: Negative. Electronically Signed   By: Charlett NoseKevin  Dover M.D.   On: 01/14/2014 10:22   Patient's verbal consent obtained prior to lighted suction removal.  I used the Bionix lighted suction to remove the cerumen.  The cerumen was removed and a full view of tympanic membrane was possible after the procedure.  Patient tolerated procedure well.    Assessment & Plan:   Carollee HerterShannon was seen today for hearing loss.  Diagnoses and all orders for this visit:  Hearing loss due to cerumen impaction, bilateral- The cerumen has been successfully removed.  His hearing has returned to normal.  The exam afterwards is normal.   [name] does not currently have medications on file.  No orders of the defined types were placed in this encounter.    Follow-up: No follow-ups on file.  [name], MDPatient consent obtained. Irrigation with water and peroxide performed. Full view of tympanic membranes after procedure.  Patient tolerated procedure well.

## 2019-09-26 ENCOUNTER — Encounter: Payer: Self-pay | Admitting: Family Medicine

## 2019-09-26 ENCOUNTER — Other Ambulatory Visit: Payer: Self-pay

## 2019-09-26 ENCOUNTER — Ambulatory Visit: Payer: 59 | Admitting: Family Medicine

## 2019-09-26 DIAGNOSIS — M2142 Flat foot [pes planus] (acquired), left foot: Secondary | ICD-10-CM

## 2019-09-26 DIAGNOSIS — M545 Low back pain, unspecified: Secondary | ICD-10-CM | POA: Insufficient documentation

## 2019-09-26 DIAGNOSIS — M2141 Flat foot [pes planus] (acquired), right foot: Secondary | ICD-10-CM | POA: Diagnosis not present

## 2019-09-26 DIAGNOSIS — G8929 Other chronic pain: Secondary | ICD-10-CM | POA: Diagnosis not present

## 2019-09-26 MED ORDER — MELOXICAM 7.5 MG PO TABS: 7.5000 mg | ORAL_TABLET | Freq: Every day | ORAL | 0 refills | Status: DC

## 2019-09-26 NOTE — Patient Instructions (Addendum)
Good to see you Spenco orthotics "total support" Meloxicam daily for 5 days then as needed Get new shoes Vitamin D 2000 daily  Exercise 3 times a week See me again in 4 weeks if not perfect

## 2019-09-26 NOTE — Assessment & Plan Note (Signed)
Low back pain, seems to be multifactorial, I do believe that the alignment from the feet is also contributing.  Discussed icing regimen and home exercises, anti-inflammatories will be beneficial short-term.  Work with Event organiser.  Follow-up again in 4 to 6 weeks.  Worsening symptoms consider x-rays and possible physical therapy.

## 2019-09-26 NOTE — Assessment & Plan Note (Signed)
Severe pes planus causing alignment issues.  Discussed with patient about custom orthotics the patient would like to try new shoes first.  We discussed over-the-counter orthotics as well that can be beneficial.  Discussed avoiding being barefoot even in the house for short long time.  Discussed icing regimen, home exercise, which activities to do which wants to avoid.  Meloxicam given that I think will be beneficial to decrease inflammation.  Home exercises given and work with Event organiser.  Follow-up again in 4 to 6 weeks

## 2019-09-26 NOTE — Progress Notes (Signed)
[facility] [address] [place] Phone: [phone] Subjective:   I Raymond Cantu am serving as a Education administrator for Dr. Hulan Saas.  This visit occurred during the SARS-CoV-2 public health emergency.  Safety protocols were in place, including screening questions prior to the visit, additional usage of staff PPE, and extensive cleaning of exam room while observing appropriate contact time as indicated for disinfecting solutions.   I'm seeing this patient by the request  of:  Janith Lima, MD  CC: Low back pain, foot pain  EYC:[id number]  Raymond Cantu is a 34 y.o. male coming in with complaint of bilateral ankle, knee and back pain. Patient states his boots is the cause of the problem. Patient states his knees and back are locked. Ankle pain is in the joint. Pain with work, walking, standing etc. Has tried ice and other modalities and it doesn't help. Back pain is muscular. Patient wears steel toes. Wears insoles and understands he needs better boots.       No past medical history on file. No past surgical history on file. Social History   Socioeconomic History  . Marital status: Married    Spouse name: Not on file  . Number of children: Not on file  . Years of education: Not on file  . Highest education level: Not on file  Occupational History  . Not on file  Tobacco Use  . Smoking status: Never Smoker  . Smokeless tobacco: Never Used  Substance and Sexual Activity  . Alcohol use: Not Currently  . Drug use: Never  . Sexual activity: Yes    Partners: Female    Birth control/protection: None  Other Topics Concern  . Not on file  Social History Narrative  . Not on file   Social Determinants of Health   Financial Resource Strain:   . Difficulty of Paying Living Expenses:   Food Insecurity:   . Worried About Charity fundraiser in the Last Year:   . Arboriculturist in the Last Year:   Transportation Needs:   .  Film/video editor (Medical):   Marland Kitchen Lack of Transportation (Non-Medical):   Physical Activity:   . Days of Exercise per Week:   . Minutes of Exercise per Session:   Stress:   . Feeling of Stress :   Social Connections:   . Frequency of Communication with Friends and Family:   . Frequency of Social Gatherings with Friends and Family:   . Attends Religious Services:   . Active Member of Clubs or Organizations:   . Attends Archivist Meetings:   Marland Kitchen Marital Status:    Not on File Family History  Problem Relation Age of Onset  . Diabetes Mellitus II Mother   . Heart failure Mother   . Diabetes Mellitus II Father        Current Outpatient Medications (Analgesics):  .  meloxicam (MOBIC) 7.5 MG tablet, Take 1 tablet (7.5 mg total) by mouth daily.     Reviewed prior external information including notes and imaging from  primary care provider As well as notes that were available from care everywhere and other healthcare systems.  Past medical history, social, surgical and family history all reviewed in electronic medical record.  No pertanent information unless stated regarding to the chief complaint.   Review of Systems:  No headache, visual changes, nausea, vomiting, diarrhea, constipation, dizziness, abdominal pain, skin rash, fevers, chills, night sweats, weight loss,  swollen lymph nodes, body aches, joint swelling, chest pain, shortness of breath, mood changes. POSITIVE muscle aches  Objective  Blood pressure 110/80, pulse 84, height 6\' 1"  (1.854 m), weight 218 lb (98.9 kg), SpO2 98 %.   General: No apparent distress alert and oriented x3 mood and affect normal, dressed appropriately.  HEENT: Pupils equal, extraocular movements intact  Respiratory: Patient's speak in full sentences and does not appear short of breath  Cardiovascular: No lower extremity edema, non tender, no erythema  Skin: Warm dry intact with no signs of infection or rash on extremities or on  axial skeleton.  Abdomen: Soft nontender  Neuro: Cranial nerves II through XII are intact, neurovascularly intact in all extremities with 2+ DTRs and 2+ pulses.  Lymph: No lymphadenopathy of posterior or anterior cervical chain or axillae bilaterally.  Gait normal with good balance and coordination.  MSK:  tender with full range of motion and good stability and symmetric strength and tone of shoulders, elbows, wrist, hip, knee and ankles bilaterally.  No shows the patient does have severe pes planus with overpronation of the hindfoot left greater than right.  Mild splaying between the first and second toe noted.  Patient does have some mild tightness of the ankle but does have full range of motion.  Knees fairly unremarkable.  Patient's low back though does have some loss of lordosis.  Tender to palpation in paraspinal musculature lumbar spine tightness with .  Negative straight leg test.  Minimal discomfort to true palpation at the ED.  [id number]; 15 additional minutes spent for Therapeutic exercises as stated in above notes.  This included exercises focusing on stretching, strengthening, with significant focus on eccentric aspects.   Long term goals include an improvement in range of motion, strength, endurance as well as avoiding reinjury. Patient's frequency would include in 1-2 times a day, 3-5 times a week for a duration of 6-12 weeks. Ankle strengthening that included:  Basic range of motion exercises to allow proper full motion at ankle Stretching of the lower leg and hamstrings  Theraband exercises for the lower leg - inversion, eversion, dorsiflexion and plantarflexion each to be completed with a theraband Balance exercises to increase proprioception Weight bearing exercises to increase strength and balance   Proper technique shown and discussed handout in great detail with ATC.  All questions were discussed and answered.     Impression and Recommendations:     This case required  medical decision making of moderate complexity. The above documentation has been reviewed and is accurate and complete Raymond Brownie, DO       Note: This dictation was prepared with Dragon dictation along with smaller phrase technology. Any transcriptional errors that result from this process are unintentional.

## 2019-10-25 ENCOUNTER — Ambulatory Visit: Payer: 59 | Admitting: Family Medicine

## 2020-01-28 ENCOUNTER — Ambulatory Visit (INDEPENDENT_AMBULATORY_CARE_PROVIDER_SITE_OTHER): Payer: 59 | Admitting: Internal Medicine

## 2020-01-28 ENCOUNTER — Other Ambulatory Visit: Payer: Self-pay

## 2020-01-28 ENCOUNTER — Encounter: Payer: Self-pay | Admitting: Internal Medicine

## 2020-01-28 VITALS — BP 120/88 | HR 68 | Temp 98.5°F | Ht 73.0 in | Wt 208.0 lb

## 2020-01-28 DIAGNOSIS — Z0000 Encounter for general adult medical examination without abnormal findings: Secondary | ICD-10-CM | POA: Diagnosis not present

## 2020-01-28 DIAGNOSIS — Z1159 Encounter for screening for other viral diseases: Secondary | ICD-10-CM | POA: Insufficient documentation

## 2020-01-28 NOTE — Progress Notes (Signed)
Subjective:  Patient ID: Raymond Cantu, male    DOB: Jul 31, 1985  Age: 34 y.o. MRN: [id number]  CC: Annual Exam  This visit occurred during the SARS-CoV-2 public health emergency.  Safety protocols were in place, including screening questions prior to the visit, additional usage of staff PPE, and extensive cleaning of exam room while observing appropriate contact time as indicated for disinfecting solutions.    HPI Raymond Cantu presents for a CPX.  He has felt well recently and offers no complaints.  He is not taking anything for pain.  He is active and denies any recent episodes of chest pain, shortness of breath, or edema.  Outpatient Medications Prior to Visit  Medication Sig Dispense Refill  . meloxicam (MOBIC) 7.5 MG tablet Take 1 tablet (7.5 mg total) by mouth daily. 30 tablet 0   No facility-administered medications prior to visit.    ROS Review of Systems  Constitutional: Negative for diaphoresis and fatigue.  HENT: Negative.   Eyes: Negative for visual disturbance.  Respiratory: Negative for cough, chest tightness, shortness of breath and wheezing.   Cardiovascular: Negative for chest pain, palpitations and leg swelling.  Gastrointestinal: Negative for constipation, diarrhea and nausea.  Genitourinary: Negative.  Negative for difficulty urinating, scrotal swelling and testicular pain.  Musculoskeletal: Negative.  Negative for arthralgias.  Skin: Negative.  Negative for color change.  Neurological: Negative.  Negative for dizziness, weakness and light-headedness.  Hematological: Negative for adenopathy. Does not bruise/bleed easily.  Psychiatric/Behavioral: Negative.     Objective:  BP 120/88 (BP Location: Right Arm, Patient Position: Sitting, Cuff Size: Normal)   Pulse 68   Temp 98.5 F (36.9 C) (Oral)   Ht 6\' 1"  (1.854 m)   Wt 208 lb (94.3 kg)   SpO2 99%   BMI 27.44 kg/m   BP Readings from Last 3 Encounters:  01/28/20 120/88  09/26/19  110/80  02/08/19 130/70    Wt Readings from Last 3 Encounters:  01/28/20 208 lb (94.3 kg)  09/26/19 218 lb (98.9 kg)  02/08/19 203 lb (92.1 kg)    Physical Exam Vitals reviewed.  Constitutional:      Appearance: Normal appearance.  HENT:     Mouth/Throat:     Mouth: Mucous membranes are moist.  Eyes:     General: No scleral icterus.    Conjunctiva/sclera: Conjunctivae normal.  Cardiovascular:     Rate and Rhythm: Normal rate and regular rhythm.     Heart sounds: No murmur heard.   Pulmonary:     Effort: Pulmonary effort is normal.     Breath sounds: No stridor. No wheezing, rhonchi or rales.  Abdominal:     General: Abdomen is flat.     Palpations: There is no mass.     Tenderness: There is no abdominal tenderness. There is no guarding.  Musculoskeletal:        General: Normal range of motion.     Cervical back: Neck supple.     Right lower leg: No edema.     Left lower leg: No edema.  Lymphadenopathy:     Cervical: No cervical adenopathy.  Skin:    General: Skin is warm and dry.     Coloration: Skin is not pale.  Neurological:     General: No focal deficit present.     Mental Status: He is alert.  Psychiatric:        Mood and Affect: Mood normal.        Behavior: Behavior normal.  Lab Results  Component Value Date   GLUCOSE 75 01/25/2019   CHOL 155 01/25/2019   TRIG 140.0 01/25/2019   HDL 38.70 (L) 01/25/2019   LDLCALC 89 01/25/2019   NA 138 01/25/2019   K 4.2 01/25/2019   CL 104 01/25/2019   CREATININE 1.10 01/25/2019   BUN 13 01/25/2019   CO2 28 01/25/2019   HGBA1C 5.6 01/25/2019    DG Knee Bilateral Standing AP  Result Date: 01/14/2014 CLINICAL DATA:  Right knee pain. EXAM: BILATERAL KNEES STANDING - 1 VIEW COMPARISON:  None. FINDINGS: Standing views of both knees demonstrates no evidence of joint space narrowing, osteophyte formation, or other abnormality. IMPRESSION: Negative. Electronically Signed   By: Geanie Cooley M.D.   On: 01/14/2014  10:22   DG Knee Complete 4 Views Right  Result Date: 01/14/2014 CLINICAL DATA:  Chronic right knee pain.  No known injury. EXAM: RIGHT KNEE - COMPLETE 4+ VIEW COMPARISON:  None. FINDINGS: There is no evidence of fracture, dislocation, or joint effusion. There is no evidence of arthropathy or other focal bone abnormality. Soft tissues are unremarkable. IMPRESSION: Negative. Electronically Signed   By: Charlett Nose M.D.   On: 01/14/2014 10:22    Assessment & Plan:   Angeldejesus was seen today for annual exam.  Diagnoses and all orders for this visit:  Routine general medical examination at a health care facility- Exam completed, labs reviewed, vaccines are up-to-date, patient education was given. -     Hepatitis C antibody; Future -     HIV Antibody (routine testing w rflx); Future  Need for hepatitis C screening test -     Hepatitis C antibody; Future   I have discontinued [name]'s meloxicam.  No orders of the defined types were placed in this encounter.    Follow-up: Return if symptoms worsen or fail to improve.  Sanda Linger, MD

## 2020-01-28 NOTE — Patient Instructions (Signed)

## 2020-01-29 LAB — HIV ANTIBODY (ROUTINE TESTING W REFLEX): HIV 1&2 Ab, 4th Generation: NONREACTIVE

## 2020-01-29 LAB — HEPATITIS C ANTIBODY
Hepatitis C Ab: NONREACTIVE
SIGNAL TO CUT-OFF: 0.03 (ref <1.00)
